# Patient Record
Sex: Female | Born: 1985 | Race: Black or African American | Hispanic: No | Marital: Single | Smoking: Never smoker
Health system: Southern US, Community
[De-identification: ages and names within clinical notes are randomized; demographics above are authoritative.]

## PROBLEM LIST (undated history)

## (undated) HISTORY — PX: BREAST LUMPECTOMY: SHX2

---

## 2007-03-28 ENCOUNTER — Ambulatory Visit (HOSPITAL_COMMUNITY): Admission: RE | Admit: 2007-03-28 | Discharge: 2007-03-28 | Payer: Self-pay | Admitting: Obstetrics and Gynecology

## 2010-05-09 ENCOUNTER — Encounter: Payer: Self-pay | Admitting: Obstetrics and Gynecology

## 2013-03-16 ENCOUNTER — Encounter (HOSPITAL_COMMUNITY): Payer: Self-pay | Admitting: Emergency Medicine

## 2013-03-16 ENCOUNTER — Emergency Department (HOSPITAL_COMMUNITY)
Admission: EM | Admit: 2013-03-16 | Discharge: 2013-03-16 | Disposition: A | Discharge status: Home / Self Care | Payer: Self-pay | Attending: Emergency Medicine | Admitting: Emergency Medicine

## 2013-03-16 DIAGNOSIS — K089 Disorder of teeth and supporting structures, unspecified: Secondary | ICD-10-CM | POA: Insufficient documentation

## 2013-03-16 DIAGNOSIS — K029 Dental caries, unspecified: Secondary | ICD-10-CM | POA: Insufficient documentation

## 2013-03-16 DIAGNOSIS — R609 Edema, unspecified: Secondary | ICD-10-CM | POA: Insufficient documentation

## 2013-03-16 DIAGNOSIS — K0889 Other specified disorders of teeth and supporting structures: Secondary | ICD-10-CM

## 2013-03-16 MED ORDER — PENICILLIN V POTASSIUM 500 MG PO TABS: 500.0000 mg | ORAL_TABLET | Freq: Three times a day (TID) | ORAL | Status: DC

## 2013-03-16 MED ORDER — PENICILLIN V POTASSIUM 500 MG PO TABS
500.0000 mg | ORAL_TABLET | Freq: Once | ORAL | Status: AC
  Administered 2013-03-16: 500 mg via ORAL
  Filled 2013-03-16: qty 1

## 2013-03-16 MED ORDER — IBUPROFEN 800 MG PO TABS
800.0000 mg | ORAL_TABLET | Freq: Once | ORAL | Status: AC
  Administered 2013-03-16: 800 mg via ORAL
  Filled 2013-03-16: qty 1

## 2013-03-16 MED ORDER — HYDROCODONE-ACETAMINOPHEN 5-325 MG PO TABS: 1.0000 | ORAL_TABLET | ORAL | Status: DC | PRN

## 2013-03-16 NOTE — ED Notes (Signed)
Pt states that she was here last night for lt sided dental pain.  States she was given a shot that numbed her mouth but "her teeth still hurt".

## 2013-03-16 NOTE — ED Notes (Signed)
Pt states she has two teeth on the bottom left that have been chipped down to the gum line and she has been in pain all day  Pt states she has taken OTC medication at home without relief  Pt has swelling noted to her lower jaw area

## 2013-03-16 NOTE — ED Provider Notes (Signed)
Medical screening examination/treatment/procedure(s) were performed by non-physician practitioner and as supervising physician I was immediately available for consultation/collaboration.  EKG Interpretation   None         Alita Waldren M Jerlyn Pain, MD 03/16/13 0851 

## 2013-03-16 NOTE — ED Provider Notes (Signed)
CSN: [id number]     Arrival date & time 03/16/13  0705 History   None    Chief Complaint  Patient presents with  . Dental Pain    HPI  April Allen is a 27 y.o. female with no PMH who presents to the ED for evaluation of dental pain.  History was provided by the patient.  Patient states that she has had dental pain for the past 24 hours.  Her pain is located in her left lower front molars.  Her pain is a constant throbbing pain.  She was seen in the ED overnight and prescribed penicillin and Vicodin but did not fill the prescriptions because the pharmacy was not open yet.  She went home but cannot sleep due to severe pain.  She denies any dental trauma/injuries.  No worsening of her condition.  She is more concerned with severe pain.  No fevers, headache, difficulty swallowing/breathing, neck pain, vision changes, abdominal pain, nausea, or emesis.  She drove to the ED and cannot get a ride home.  She does not have a dentist.  No hx of tobacco use.  LNMP 02/16/13    No past medical history on file. No past surgical history on file. Family History  Problem Relation Age of Onset  . Hypertension Other   . Diabetes Other   . Kidney disease Other    History  Substance Use Topics  . Smoking status: Never Smoker   . Smokeless tobacco: Not on file  . Alcohol Use: No   OB History   Grav Para Term Preterm Abortions TAB SAB Ect Mult Living                 Review of Systems  Constitutional: Negative for fever, chills, diaphoresis, activity change, appetite change and fatigue.  HENT: Positive for dental problem. Negative for congestion, ear pain, mouth sores, rhinorrhea, sore throat and trouble swallowing.   Eyes: Negative for visual disturbance.  Respiratory: Negative for cough and shortness of breath.   Cardiovascular: Negative for chest pain and leg swelling.  Gastrointestinal: Negative for nausea, vomiting and abdominal pain.  Musculoskeletal: Negative for back pain, myalgias, neck  pain and neck stiffness.  Skin: Negative for wound.  Neurological: Negative for weakness and headaches.    Allergies  Review of patient's allergies indicates no known allergies.  Home Medications   Current Outpatient Rx  Name  Route  Sig  Dispense  Refill  . HYDROcodone-acetaminophen (NORCO/VICODIN) 5-325 MG per tablet   Oral   Take 1 tablet by mouth every 4 (four) hours as needed for moderate pain.   20 tablet   0   . penicillin v potassium (VEETID) 500 MG tablet   Oral   Take 1 tablet (500 mg total) by mouth 3 (three) times daily.   30 tablet   0    LMP 02/16/2013  Filed Vitals:   03/16/13 0754  BP: 125/67  Pulse: 96  Temp: 98.7 F (37.1 C)  TempSrc: Oral  Resp: 20  SpO2: 100%    Physical Exam  Nursing note and vitals reviewed. Constitutional: She is oriented to person, place, and time. She appears well-developed and well-nourished. No distress.  HENT:  Head: Normocephalic and atraumatic.    Right Ear: External ear normal.  Left Ear: External ear normal.  Nose: Nose normal.  Mouth/Throat: Oropharynx is clear and moist. No oropharyngeal exudate.    Right TM unable to be visualized.  Left TM gray and translucent.  No trismus.  Dental caries present to the left front molars with no gingival edema/fluctuance.  Edema and induration to the left lower mandible which does not extend past the jaw line.  Uvula midline.    Eyes: Conjunctivae and EOM are normal. Pupils are equal, round, and reactive to light. Right eye exhibits no discharge. Left eye exhibits no discharge.  Neck: Normal range of motion. Neck supple.  No neck edema/tenderness throughout.  No LAD.  No limitations with ROM.    Cardiovascular: Normal rate, regular rhythm and normal heart sounds.  Exam reveals no gallop and no friction rub.   No murmur heard. Pulmonary/Chest: Effort normal and breath sounds normal. No respiratory distress. She has no wheezes. She has no rales. She exhibits no tenderness.   Abdominal: Soft. She exhibits no distension. There is no tenderness.  Musculoskeletal: Normal range of motion. She exhibits no edema and no tenderness.  Neurological: She is alert and oriented to person, place, and time.  Skin: Skin is warm and dry. She is not diaphoretic.    ED Course  Procedures (including critical care time) Labs Review Labs Reviewed - No data to display Imaging Review No results found.  EKG Interpretation   None       MDM   [name] is a 27 y.o. female with no PMH who presents to the ED for evaluation of dental pain.  Ibuprofen and dose of penicillin given.  Ibuprofen and Penicillin given.    Rechecks  8:00 AM = patient resting comfortably.  Pain improved.  States she feels "much better."  Spoke with patient about discharge instructions, follow-up, and return precautions.       Etiology of dental pain possibly due to dental caries with dental abscess.  No concerning signs/symptoms for Ludwig's Angina at this time.  Patient afebrile and non-toxic in appearance.  Patient instructed to follow-up with a dentist for further evaluation and management.  Resources provided.  Return precautions were given.  Patient has prescription for Vicodin and Penicillin from her previous visit.  Pain improved before discharge. Patient in agreement with discharge and plan.    Discharge Medication List as of 03/16/2013  8:29 AM       Final impressions: 1. Pain, dental       April Allen        April Ledger, PA-C 03/16/13 [phone]

## 2013-03-16 NOTE — ED Provider Notes (Signed)
CSN: [id number]     Arrival date & time 03/16/13  0030 History   First MD Initiated Contact with Patient 03/16/13 0035     Chief Complaint  Patient presents with  . Dental Pain   (Consider location/radiation/quality/duration/timing/severity/associated sxs/prior Treatment) HPI History provided by pt.   Pt presents w/ severe left lower dental pain that she woke with this morning.  Aggravated by cold air/liquids.  No relief w/ tylenol.  No associated sx.  Does not currently have a dentist.  History reviewed. No pertinent past medical history. History reviewed. No pertinent past surgical history. Family History  Problem Relation Age of Onset  . Hypertension Other   . Diabetes Other   . Kidney disease Other    History  Substance Use Topics  . Smoking status: Never Smoker   . Smokeless tobacco: Not on file  . Alcohol Use: No   OB History   Grav Para Term Preterm Abortions TAB SAB Ect Mult Living                 Review of Systems  All other systems reviewed and are negative.    Allergies  Review of patient's allergies indicates no known allergies.  Home Medications   Current Outpatient Rx  Name  Route  Sig  Dispense  Refill  . HYDROcodone-acetaminophen (NORCO/VICODIN) 5-325 MG per tablet   Oral   Take 1 tablet by mouth every 4 (four) hours as needed for moderate pain.   20 tablet   0   . penicillin v potassium (VEETID) 500 MG tablet   Oral   Take 1 tablet (500 mg total) by mouth 3 (three) times daily.   30 tablet   0    BP 140/99  Pulse 121  Temp(Src) 98.3 F (36.8 C) (Oral)  Resp 20  Wt 156 lb (70.761 kg)  SpO2 99%  LMP 02/16/2013 Physical Exam  Nursing note and vitals reviewed. Constitutional: She is oriented to person, place, and time. She appears well-developed and well-nourished.  HENT:  Head: Normocephalic and atraumatic.  Mouth/Throat: Uvula is midline and oropharynx is clear and moist. No trismus in the jaw.  L lower 1st and 2nd molars w/ advanced  decay.  Both ttp w/ guarding.  Adjacent gingiva appears normal but is also ttp.  No edema of buccal mucosa.    Eyes:  Normal appearance  Neck: Normal range of motion. Neck supple.  No submandibular edema  Lymphadenopathy:    She has no cervical adenopathy.  Neurological: She is alert and oriented to person, place, and time.  Psychiatric: She has a normal mood and affect. Her behavior is normal.    ED Course  NERVE BLOCK Date/Time: 03/16/2013 7:58 AM Performed by: Otilio Miu Authorized by: Ruby Cola E Consent: Verbal consent obtained. Risks and benefits: risks, benefits and alternatives were discussed Consent given by: patient Patient understanding: patient states understanding of the procedure being performed Patient identity confirmed: verbally with patient Indications: pain relief Body area: face/mouth Laterality: right Patient sedated: no Patient position: sitting Needle gauge: 25 G Local anesthetic: bupivacaine 0.25% with epinephrine Anesthetic total: 1.8 ml Outcome: pain unchanged Patient tolerance: Patient tolerated the procedure well with no immediate complications. Comments: Periapical block at 1st and 2nd L lower molars.  Pain resolved completely for ~15 minutes and then returned to baseline.     (including critical care time)  Labs Review Labs Reviewed - No data to display Imaging Review No results found.  EKG Interpretation  None       MDM   1. Pain, dental    27yo F presents w/ dental pain w/ buccal mucosa edema.  Suspect periapical abscess.  Periapical block performed w/ immediate but temporary relief.  Pt prescribed penicillin and short course of vicodin and referred to dentist on call.  Return precautions discussed.     Otilio Miu, PA-C 03/16/13 [phone]

## 2013-03-17 ENCOUNTER — Emergency Department (HOSPITAL_COMMUNITY)
Admission: EM | Admit: 2013-03-17 | Discharge: 2013-03-17 | Disposition: A | Discharge status: Home / Self Care | Payer: Self-pay | Attending: Emergency Medicine | Admitting: Emergency Medicine

## 2013-03-17 ENCOUNTER — Encounter (HOSPITAL_COMMUNITY): Payer: Self-pay | Admitting: Emergency Medicine

## 2013-03-17 DIAGNOSIS — K029 Dental caries, unspecified: Secondary | ICD-10-CM | POA: Insufficient documentation

## 2013-03-17 DIAGNOSIS — K047 Periapical abscess without sinus: Secondary | ICD-10-CM | POA: Insufficient documentation

## 2013-03-17 DIAGNOSIS — R000 Tachycardia, unspecified: Secondary | ICD-10-CM | POA: Insufficient documentation

## 2013-03-17 DIAGNOSIS — Z792 Long term (current) use of antibiotics: Secondary | ICD-10-CM | POA: Insufficient documentation

## 2013-03-17 MED ORDER — IBUPROFEN 800 MG PO TABS
800.0000 mg | ORAL_TABLET | Freq: Once | ORAL | Status: AC
  Administered 2013-03-17: 800 mg via ORAL
  Filled 2013-03-17: qty 1

## 2013-03-17 NOTE — ED Provider Notes (Signed)
CSN: [id number]     Arrival date & time 03/17/13  0200 History   First MD Initiated Contact with Patient 03/17/13 0214     Chief Complaint  Patient presents with  . Dental Pain   HPI  History provided by the patient and recent medical chart. Patient is a 27 year old female who returns with complaints of worsening pain and swelling to her left lower mouth and jaw area. Patient was seen early yesterday morning around midnight with similar complaints of pain and swelling. Her symptoms began on Friday and had worsened. She was treated with a dental block in the emergency department and given prescriptions for pain and antibiotic medications. She returned home feeling improved was able to sleep and rest but her symptoms have returned and worsened later in the morning and she returned to the emergency department and was again seen and evaluated for her similar complaint. She was encouraged to take her medications. Patient states she was able to take 3 doses of her penicillin yesterday and continue to use ibuprofen at home for pain. She had been doing well until early this morning when she awoke and had difficulty sleeping due to increased pain and swelling. She states swelling has now increased significantly in her lower jaw and mouth area. She denies having any bleeding or drainage her mouth. Denies any pain or swelling under the tongue. No difficulty breathing or swallowing. No other aggravating or alleviating factors. No other associated symptoms. No fever, chills or sweats.     History reviewed. No pertinent past medical history. History reviewed. No pertinent past surgical history. Family History  Problem Relation Age of Onset  . Hypertension Other   . Diabetes Other   . Kidney disease Other    History  Substance Use Topics  . Smoking status: Never Smoker   . Smokeless tobacco: Not on file  . Alcohol Use: No   OB History   Grav Para Term Preterm Abortions TAB SAB Ect Mult Living                  Review of Systems  Constitutional: Negative for fever.  HENT: Negative for trouble swallowing.   Respiratory: Negative for shortness of breath.   Gastrointestinal: Negative for nausea and vomiting.  All other systems reviewed and are negative.    Allergies  Review of patient's allergies indicates no known allergies.  Home Medications   Current Outpatient Rx  Name  Route  Sig  Dispense  Refill  . HYDROcodone-acetaminophen (NORCO/VICODIN) 5-325 MG per tablet   Oral   Take 1 tablet by mouth every 4 (four) hours as needed for moderate pain.   20 tablet   0   . penicillin v potassium (VEETID) 500 MG tablet   Oral   Take 1 tablet (500 mg total) by mouth 3 (three) times daily.   30 tablet   0    BP 136/65  Pulse 115  Temp(Src) 98.4 F (36.9 C) (Oral)  Resp 16  Ht 5\' 5"  (1.651 m)  Wt 156 lb (70.761 kg)  BMI 25.96 kg/m2  SpO2 100%  LMP 02/16/2013 Physical Exam  Nursing note and vitals reviewed. Constitutional: She is oriented to person, place, and time. She appears well-developed and well-nourished.  HENT:  Head: Normocephalic and atraumatic.  Mouth/Throat: Uvula is midline and oropharynx is clear and moist. No trismus in the jaw.  L lower 1st and 2nd molars w/ advanced decay.  Both ttp w/ guarding.  Adjacent gingiva are swollen with  slight fluctuance. There is additional tenderness over this area. No bleeding or drainage. No pain or swelling under the tongue. No signs concerning for Ludwick's angina.    Eyes:  Normal appearance  Neck: Normal range of motion. Neck supple.  No submandibular edema  Cardiovascular: Regular rhythm.  Tachycardia present.   No murmur heard. Pulmonary/Chest: No respiratory distress. She has no wheezes. She has no rales.  Lymphadenopathy:    She has no cervical adenopathy.  Neurological: She is alert and oriented to person, place, and time.  Skin: Skin is warm.  Psychiatric: She has a normal mood and affect. Her behavior is normal.     ED Course  Procedures    DIAGNOSTIC STUDIES: Oxygen Saturation is 100% on room air.  COORDINATION OF CARE:  Nursing notes reviewed. Vital signs reviewed. Initial pt interview and examination performed.   2:23 AM-patient seen and evaluated. Patient returning with worsening swelling and pain left lower mouth and dentition. There is fluctuance to the area. Discussed treatment plan with pt at bedside, which includes dental block an I&D. Pt agrees with plan.  INCISION AND DRAINAGE Performed by: Angus Seller Consent: Verbal consent obtained. Risks and benefits: risks, benefits and alternatives were discussed Type: Dental abscess  Body area: Left lower molar and premolar area  Anesthesia: local infiltration  Incision was made with a scalpel.  Local anesthetic: Bupivacaine 0.5% with epinephrine  Anesthetic total: 1.8 ml  Complexity: Simple  Drainage: purulent.   Drainage amount: Small  Packing material: None  Patient tolerance: Patient tolerated the procedure well with no immediate complications.       MDM   1. Dental abscess        Angus Seller, PA-C 03/17/13 [phone]

## 2013-03-17 NOTE — ED Provider Notes (Signed)
Medical screening examination/treatment/procedure(s) were performed by non-physician practitioner and as supervising physician I was immediately available for consultation/collaboration.    Sunnie Nielsen, MD 03/17/13 0630

## 2013-03-17 NOTE — ED Notes (Signed)
Pt c/o LL dental pain with swelling noted. Pt c/o pain to area. Pt has been seen x 2 at this facility for same in last 24 hours. Pt has not taken Vicodin for pain only Ibuprofen.

## 2013-03-21 NOTE — ED Provider Notes (Signed)
Medical screening examination/treatment/procedure(s) were performed by non-physician practitioner and as supervising physician I was immediately available for consultation/collaboration.  Flint Melter, MD 03/21/13 [phone]

## 2014-02-05 ENCOUNTER — Encounter (HOSPITAL_COMMUNITY): Payer: Self-pay | Admitting: Emergency Medicine

## 2014-02-05 ENCOUNTER — Emergency Department (HOSPITAL_COMMUNITY)
Admission: EM | Admit: 2014-02-05 | Discharge: 2014-02-05 | Disposition: A | Discharge status: Home / Self Care | Payer: Self-pay | Attending: Emergency Medicine | Admitting: Emergency Medicine

## 2014-02-05 DIAGNOSIS — M546 Pain in thoracic spine: Secondary | ICD-10-CM | POA: Insufficient documentation

## 2014-02-05 DIAGNOSIS — M545 Low back pain: Secondary | ICD-10-CM | POA: Insufficient documentation

## 2014-02-05 DIAGNOSIS — R197 Diarrhea, unspecified: Secondary | ICD-10-CM | POA: Insufficient documentation

## 2014-02-05 DIAGNOSIS — R112 Nausea with vomiting, unspecified: Secondary | ICD-10-CM | POA: Insufficient documentation

## 2014-02-05 DIAGNOSIS — R1033 Periumbilical pain: Secondary | ICD-10-CM | POA: Insufficient documentation

## 2014-02-05 DIAGNOSIS — Z3202 Encounter for pregnancy test, result negative: Secondary | ICD-10-CM | POA: Insufficient documentation

## 2014-02-05 DIAGNOSIS — M549 Dorsalgia, unspecified: Secondary | ICD-10-CM

## 2014-02-05 LAB — URINALYSIS, ROUTINE W REFLEX MICROSCOPIC
Bilirubin Urine: NEGATIVE
Glucose, UA: NEGATIVE mg/dL
KETONES UR: NEGATIVE mg/dL
LEUKOCYTES UA: NEGATIVE
Nitrite: NEGATIVE
PROTEIN: NEGATIVE mg/dL
Specific Gravity, Urine: 1.025 (ref 1.005–1.030)
Urobilinogen, UA: 1 mg/dL (ref 0.0–1.0)
pH: 7 (ref 5.0–8.0)

## 2014-02-05 LAB — URINE MICROSCOPIC-ADD ON

## 2014-02-05 LAB — PREGNANCY, URINE: Preg Test, Ur: NEGATIVE

## 2014-02-05 MED ORDER — HYDROCODONE-ACETAMINOPHEN 5-325 MG PO TABS: 1.0000 | ORAL_TABLET | ORAL | Status: DC | PRN

## 2014-02-05 MED ORDER — CYCLOBENZAPRINE HCL 10 MG PO TABS
10.0000 mg | ORAL_TABLET | Freq: Two times a day (BID) | ORAL | Status: DC | PRN
Start: 2014-02-05 — End: 2021-03-22

## 2014-02-05 MED ORDER — IBUPROFEN 800 MG PO TABS
800.0000 mg | ORAL_TABLET | Freq: Once | ORAL | Status: AC
  Administered 2014-02-05: 800 mg via ORAL
  Filled 2014-02-05: qty 1

## 2014-02-05 MED ORDER — IBUPROFEN 800 MG PO TABS: 800.0000 mg | ORAL_TABLET | Freq: Three times a day (TID) | ORAL | Status: DC

## 2014-02-05 NOTE — ED Provider Notes (Signed)
CSN: [id number]     Arrival date & time 02/05/14  1019 History   First MD Initiated Contact with Patient 02/05/14 1055     Chief Complaint  Patient presents with  . Back Pain  . Nausea  . Emesis  . Diarrhea     (Consider location/radiation/quality/duration/timing/severity/associated sxs/prior Treatment) Patient is a 28 y.o. female presenting with back pain, vomiting, diarrhea, and abdominal pain. The history is provided by the patient. No language interpreter was used.  Back Pain Location:  Thoracic spine and lumbar spine Quality:  Aching and stabbing Pain severity:  Severe Onset quality:  Gradual Timing:  Constant Progression:  Worsening Chronicity:  New Associated symptoms: abdominal pain   Associated symptoms: no chest pain, no dysuria, no fever, no numbness, no pelvic pain and no weakness   Associated symptoms comment:  Bilateral paraspinal back pain from thorax to lumbar spine. 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 She started having pain last night that is worse today, and is described as worse with movement and better with rest. The pain radiates today to right hip. No weakness of lower extremities, urinary or bowel incontinence. Emesis Associated symptoms: abdominal pain and diarrhea   Associated symptoms: no chills   Diarrhea Associated symptoms: abdominal pain and vomiting   Associated symptoms: no chills and no fever   Abdominal Pain Pain location:  Periumbilical Pain quality: aching   Pain radiates to:  Does not radiate Pain severity:  Mild Associated symptoms: diarrhea and vomiting   Associated symptoms: no chest pain, no chills, no cough, no dysuria, no fever, no shortness of breath, no vaginal bleeding and no vaginal discharge   Associated symptoms comment:  She feels the pain from her back is affecting her abdomen around the umbilicus. She has had nausea, vomiting and diarrhea since onset of back pain. No pelvic  pain, dysuria, vaginal discharge or irregular menses. No fever.    History reviewed. No pertinent past medical history. No past surgical history on file. Family History  Problem Relation Age of Onset  . Hypertension Other   . Diabetes Other   . Kidney disease Other    History  Substance Use Topics  . Smoking status: Never Smoker   . Smokeless tobacco: Not on file  . Alcohol Use: No   OB History   Grav Para Term Preterm Abortions TAB SAB Ect Mult Living                 Review of Systems  Constitutional: Negative for fever and chills.  HENT: Negative.   Respiratory: Negative.  Negative for cough and shortness of breath.   Cardiovascular: Negative.  Negative for chest pain.  Gastrointestinal: Positive for vomiting, abdominal pain and diarrhea.  Genitourinary: Negative.  Negative for dysuria, vaginal bleeding, vaginal discharge, menstrual problem and pelvic pain.  Musculoskeletal: Positive for back pain.  Skin: Negative.  Negative for color change and wound.  Neurological: Negative.  Negative for weakness and numbness.      Allergies  Review of patient's allergies indicates no known allergies.  Home Medications   Prior to Admission medications   Not on File   BP 113/55  Pulse 120  Temp(Src) 97.8 F (36.6 C) (Oral)  Resp 18  SpO2 100%  LMP 01/20/2014 Physical Exam  Constitutional: She is oriented to person, place, and time. She appears well-developed and well-nourished.  Neck: Normal range of motion.  Pulmonary/Chest: Effort normal.  Abdominal: Soft. Bowel  sounds are normal. She exhibits no distension and no mass. There is tenderness. There is no rebound and no guarding.  Mid-abdomen TTP without peritonitis. No distention. BS positive throughout.  Musculoskeletal:  Midline and bilateral paraspinal tenderness from thorax to lumbar spine. No swelling or discoloration.  Neurological: She is alert and oriented to person, place, and time.  Skin: Skin is warm and dry.   Psychiatric: She has a normal mood and affect.    ED Course  Procedures (including critical care time) Labs Review Labs Reviewed  URINALYSIS, ROUTINE W REFLEX MICROSCOPIC  PREGNANCY, URINE   Results for orders placed during the hospital encounter of 02/05/14  URINALYSIS, ROUTINE W REFLEX MICROSCOPIC      Result Value Ref Range   Color, Urine YELLOW  YELLOW   APPearance CLOUDY (*) CLEAR   Specific Gravity, Urine 1.025  1.005 - 1.030   pH 7.0  5.0 - 8.0   Glucose, UA NEGATIVE  NEGATIVE mg/dL   Hgb urine dipstick TRACE (*) NEGATIVE   Bilirubin Urine NEGATIVE  NEGATIVE   Ketones, ur NEGATIVE  NEGATIVE mg/dL   Protein, ur NEGATIVE  NEGATIVE mg/dL   Urobilinogen, UA 1.0  0.0 - 1.0 mg/dL   Nitrite NEGATIVE  NEGATIVE   Leukocytes, UA NEGATIVE  NEGATIVE  PREGNANCY, URINE      Result Value Ref Range   Preg Test, Ur NEGATIVE  NEGATIVE  URINE MICROSCOPIC-ADD ON      Result Value Ref Range   Squamous Epithelial / LPF FEW (*) RARE   WBC, UA 0-2  <3 WBC/hpf   RBC / HPF 0-2  <3 RBC/hpf   Bacteria, UA RARE  RARE   Urine-Other MUCOUS PRESENT       Imaging Review No results found.   EKG Interpretation None      MDM   Final diagnoses:  None    1. Bilateral back pain 2. Periumbilical abdominal pain  The patient is very well appearing, NAD. No vomiting, diarrhea while in ED. Afebrile. She feels abdominal and back pain is related to heavy lifting at work, requesting work note to be off the job while healing, which will be provided. She reports pain increases with movement and is better with rest, following muscular pattern. We discussed return precautions in depth.      Arnoldo HookerShari A Gregor Dershem, PA-C 02/15/14 1520  Raeford RazorStephen Kohut, MD 02/20/14 1059

## 2014-02-05 NOTE — Discharge Instructions (Signed)
Abdominal Pain Many things can cause abdominal pain. Usually, abdominal pain is not caused by a disease and will improve without treatment. It can often be observed and treated at home. Your health care provider will do a physical exam and possibly order blood tests and X-rays to help determine the seriousness of your pain. However, in many cases, more time must pass before a clear cause of the pain can be found. Before that point, your health care provider may not know if you need more testing or further treatment. HOME CARE INSTRUCTIONS  Monitor your abdominal pain for any changes. The following actions may help to alleviate any discomfort you are experiencing:  Only take over-the-counter or prescription medicines as directed by your health care provider.  Do not take laxatives unless directed to do so by your health care provider.  Try a clear liquid diet (broth, tea, or water) as directed by your health care provider. Slowly move to a bland diet as tolerated. SEEK MEDICAL CARE IF:  You have unexplained abdominal pain.  You have abdominal pain associated with nausea or diarrhea.  You have pain when you urinate or have a bowel movement.  You experience abdominal pain that wakes you in the night.  You have abdominal pain that is worsened or improved by eating food.  You have abdominal pain that is worsened with eating fatty foods.  You have a fever. SEEK IMMEDIATE MEDICAL CARE IF:   Your pain does not go away within 2 hours.  You keep throwing up (vomiting).  Your pain is felt only in portions of the abdomen, such as the right side or the left lower portion of the abdomen.  You pass bloody or black tarry stools. MAKE SURE YOU:  Understand these instructions.   Will watch your condition.   Will get help right away if you are not doing well or get worse.  Document Released: 01/12/2005 Document Revised: 04/09/2013 Document Reviewed: 12/12/2012 [facility] Patient Information  2015 [place], [place]. This information is not intended to replace advice given to you by your health care provider. Make sure you discuss any questions you have with your health care provider. Cryotherapy Cryotherapy means treatment with cold. Ice or gel packs can be used to reduce both pain and swelling. Ice is the most helpful within the first 24 to 48 hours after an injury or flare-up from overusing a muscle or joint. Sprains, strains, spasms, burning pain, shooting pain, and aches can all be eased with ice. Ice can also be used when recovering from surgery. Ice is effective, has very few side effects, and is safe for most people to use. PRECAUTIONS  Ice is not a safe treatment option for people with:  Raynaud phenomenon. This is a condition affecting small blood vessels in the extremities. Exposure to cold may cause your problems to return.  Cold hypersensitivity. There are many forms of cold hypersensitivity, including:  Cold urticaria. Red, itchy hives appear on the skin when the tissues begin to warm after being iced.  Cold erythema. This is a red, itchy rash caused by exposure to cold.  Cold hemoglobinuria. Red blood cells break down when the tissues begin to warm after being iced. The hemoglobin that carry oxygen are passed into the urine because they cannot combine with blood proteins fast enough.  Numbness or altered sensitivity in the area being iced. If you have any of the following conditions, do not use ice until you have discussed cryotherapy with your caregiver:  Heart conditions,  such as arrhythmia, angina, or chronic heart disease.  High blood pressure.  Healing wounds or open skin in the area being iced.  Current infections.  Rheumatoid arthritis.  Poor circulation.  Diabetes. Ice slows the blood flow in the region it is applied. This is beneficial when trying to stop inflamed tissues from spreading irritating chemicals to surrounding tissues. However, if you expose  your skin to cold temperatures for too long or without the proper protection, you can damage your skin or nerves. Watch for signs of skin damage due to cold. HOME CARE INSTRUCTIONS Follow these tips to use ice and cold packs safely.  Place a dry or damp towel between the ice and skin. A damp towel will cool the skin more quickly, so you may need to shorten the time that the ice is used.  For a more rapid response, add gentle compression to the ice.  Ice for no more than 10 to 20 minutes at a time. The bonier the area you are icing, the less time it will take to get the benefits of ice.  Check your skin after 5 minutes to make sure there are no signs of a poor response to cold or skin damage.  Rest 20 minutes or more between uses.  Once your skin is numb, you can end your treatment. You can test numbness by very lightly touching your skin. The touch should be so light that you do not see the skin dimple from the pressure of your fingertip. When using ice, most people will feel these normal sensations in this order: cold, burning, aching, and numbness.  Do not use ice on someone who cannot communicate their responses to pain, such as small children or people with dementia. HOW TO MAKE AN ICE PACK Ice packs are the most common way to use ice therapy. Other methods include ice massage, ice baths, and cryosprays. Muscle creams that cause a cold, tingly feeling do not offer the same benefits that ice offers and should not be used as a substitute unless recommended by your caregiver. To make an ice pack, do one of the following:  Place crushed ice or a bag of frozen vegetables in a sealable plastic bag. Squeeze out the excess air. Place this bag inside another plastic bag. Slide the bag into a pillowcase or place a damp towel between your skin and the bag.  Mix 3 parts water with 1 part rubbing alcohol. Freeze the mixture in a sealable plastic bag. When you remove the mixture from the freezer, it  will be slushy. Squeeze out the excess air. Place this bag inside another plastic bag. Slide the bag into a pillowcase or place a damp towel between your skin and the bag. SEEK MEDICAL CARE IF:  You develop white spots on your skin. This may give the skin a blotchy (mottled) appearance.  Your skin turns blue or pale.  Your skin becomes waxy or hard.  Your swelling gets worse. MAKE SURE YOU:   Understand these instructions.  Will watch your condition.  Will get help right away if you are not doing well or get worse. Document Released: 11/29/2010 Document Revised: 08/19/2013 Document Reviewed: 11/29/2010 [facility] Patient Information 2015 [place], [place]. This information is not intended to replace advice given to you by your health care provider. Make sure you discuss any questions you have with your health care provider. Muscle Strain A muscle strain is an injury that occurs when a muscle is stretched beyond its normal length. Usually  a small number of muscle fibers are torn when this happens. Muscle strain is rated in degrees. First-degree strains have the least amount of muscle fiber tearing and pain. Second-degree and third-degree strains have increasingly more tearing and pain.  Usually, recovery from muscle strain takes 1-2 weeks. Complete healing takes 5-6 weeks.  CAUSES  Muscle strain happens when a sudden, violent force placed on a muscle stretches it too far. This may occur with lifting, sports, or a fall.  RISK FACTORS Muscle strain is especially common in athletes.  SIGNS AND SYMPTOMS At the site of the muscle strain, there may be:  Pain.  Bruising.  Swelling.  Difficulty using the muscle due to pain or lack of normal function. DIAGNOSIS  Your health care provider will perform a physical exam and ask about your medical history. TREATMENT  Often, the best treatment for a muscle strain is resting, icing, and applying cold compresses to the injured area.  HOME CARE  INSTRUCTIONS   Use the PRICE method of treatment to promote muscle healing during the first 2-3 days after your injury. The PRICE method involves:  Protecting the muscle from being injured again.  Restricting your activity and resting the injured body part.  Icing your injury. To do this, put ice in a plastic bag. Place a towel between your skin and the bag. Then, apply the ice and leave it on from 15-20 minutes each hour. After the third day, switch to moist heat packs.  Apply compression to the injured area with a splint or elastic bandage. Be careful not to wrap it too tightly. This may interfere with blood circulation or increase swelling.  Elevate the injured body part above the level of your heart as often as you can.  Only take over-the-counter or prescription medicines for pain, discomfort, or fever as directed by your health care provider.  Warming up prior to exercise helps to prevent future muscle strains. SEEK MEDICAL CARE IF:   You have increasing pain or swelling in the injured area.  You have numbness, tingling, or a significant loss of strength in the injured area. MAKE SURE YOU:   Understand these instructions.  Will watch your condition.  Will get help right away if you are not doing well or get worse. Document Released: 04/04/2005 Document Revised: 01/23/2013 Document Reviewed: 11/01/2012 [facility] Patient Information 2015 [place], [place]. This information is not intended to replace advice given to you by your health care provider. Make sure you discuss any questions you have with your health care provider. Back Pain, Adult Low back pain is very common. About 1 in 5 people have back pain.The cause of low back pain is rarely dangerous. The pain often gets better over time.About half of people with a sudden onset of back pain feel better in just 2 weeks. About 8 in 10 people feel better by 6 weeks.  CAUSES Some common causes of back pain include:  Strain of the  muscles or ligaments supporting the spine.  Wear and tear (degeneration) of the spinal discs.  Arthritis.  Direct injury to the back. DIAGNOSIS Most of the time, the direct cause of low back pain is not known.However, back pain can be treated effectively even when the exact cause of the pain is unknown.Answering your caregiver's questions about your overall health and symptoms is one of the most accurate ways to make sure the cause of your pain is not dangerous. If your caregiver needs more information, he or she may order lab work  or imaging tests (X-rays or MRIs).However, even if imaging tests show changes in your back, this usually does not require surgery. HOME CARE INSTRUCTIONS For many people, back pain returns.Since low back pain is rarely dangerous, it is often a condition that people can learn to [facility]manageon their own.   Remain active. It is stressful on the back to sit or stand in one place. Do not sit, drive, or stand in one place for more than 30 minutes at a time. Take short walks on level surfaces as soon as pain allows.Try to increase the length of time you walk each day.  Do not stay in bed.Resting more than 1 or 2 days can delay your recovery.  Do not avoid exercise or work.Your body is made to move.It is not dangerous to be active, even though your back may hurt.Your back will likely heal faster if you return to being active before your pain is gone.  Pay attention to your body when you bend and lift. Many people have less discomfortwhen lifting if they bend their knees, keep the load close to their bodies,and avoid twisting. Often, the most comfortable positions are those that put less stress on your recovering back.  Find a comfortable position to sleep. Use a firm mattress and lie on your side with your knees slightly bent. If you lie on your back, put a pillow under your knees.  Only take over-the-counter or prescription medicines as directed by your caregiver.  Over-the-counter medicines to reduce pain and inflammation are often the most helpful.Your caregiver may prescribe muscle relaxant drugs.These medicines help dull your pain so you can more quickly return to your normal activities and healthy exercise.  Put ice on the injured area.  Put ice in a plastic bag.  Place a towel between your skin and the bag.  Leave the ice on for 15-20 minutes, 03-04 times a day for the first 2 to 3 days. After that, ice and heat may be alternated to reduce pain and spasms.  Ask your caregiver about trying back exercises and gentle massage. This may be of some benefit.  Avoid feeling anxious or stressed.Stress increases muscle tension and can worsen back pain.It is important to recognize when you are anxious or stressed and learn ways to manage it.Exercise is a great option. SEEK MEDICAL CARE IF:  You have pain that is not relieved with rest or medicine.  You have pain that does not improve in 1 week.  You have new symptoms.  You are generally not feeling well. SEEK IMMEDIATE MEDICAL CARE IF:   You have pain that radiates from your back into your legs.  You develop new bowel or bladder control problems.  You have unusual weakness or numbness in your arms or legs.  You develop nausea or vomiting.  You develop abdominal pain.  You feel faint. Document Released: 04/04/2005 Document Revised: 10/04/2011 Document Reviewed: 08/06/2013 [facility] Patient Information 2015 [place], [place]. This information is not intended to replace advice given to you by your health care provider. Make sure you discuss any questions you have with your health care provider.

## 2014-02-05 NOTE — ED Notes (Addendum)
Pt states she works at [employer] and lifts heavy boxes all day.  C/o rt low back pain radiating to rt hip.  Also c/o NVD since yesterday w/ mid abd pain.

## 2014-04-14 ENCOUNTER — Encounter (HOSPITAL_COMMUNITY): Payer: Self-pay | Admitting: Emergency Medicine

## 2014-04-14 ENCOUNTER — Emergency Department (HOSPITAL_COMMUNITY)
Admission: EM | Admit: 2014-04-14 | Discharge: 2014-04-14 | Disposition: A | Discharge status: Home / Self Care | Payer: Self-pay | Attending: Emergency Medicine | Admitting: Emergency Medicine

## 2014-04-14 DIAGNOSIS — R600 Localized edema: Secondary | ICD-10-CM | POA: Insufficient documentation

## 2014-04-14 DIAGNOSIS — R609 Edema, unspecified: Secondary | ICD-10-CM

## 2014-04-14 LAB — CBC
HCT: 33.3 % — ABNORMAL LOW (ref 36.0–46.0)
Hemoglobin: 11.2 g/dL — ABNORMAL LOW (ref 12.0–15.0)
MCH: 28.4 pg (ref 26.0–34.0)
MCHC: 33.6 g/dL (ref 30.0–36.0)
MCV: 84.3 fL (ref 78.0–100.0)
PLATELETS: 255 10*3/uL (ref 150–400)
RBC: 3.95 MIL/uL (ref 3.87–5.11)
RDW: 12.5 % (ref 11.5–15.5)
WBC: 6.1 10*3/uL (ref 4.0–10.5)

## 2014-04-14 LAB — BASIC METABOLIC PANEL
Anion gap: 7 (ref 5–15)
BUN: 9 mg/dL (ref 6–23)
CALCIUM: 9.1 mg/dL (ref 8.4–10.5)
CO2: 25 mmol/L (ref 19–32)
CREATININE: 0.39 mg/dL — AB (ref 0.50–1.10)
Chloride: 109 mEq/L (ref 96–112)
GFR calc non Af Amer: >90 mL/min (ref >90)
Glucose, Bld: 117 mg/dL — ABNORMAL HIGH (ref 70–99)
POTASSIUM: 3.7 mmol/L (ref 3.5–5.1)
Sodium: 141 mmol/L (ref 135–145)

## 2014-04-14 LAB — BRAIN NATRIURETIC PEPTIDE: B Natriuretic Peptide: 26.7 pg/mL (ref 0.0–100.0)

## 2014-04-14 MED ORDER — POTASSIUM CHLORIDE CRYS ER 20 MEQ PO TBCR: 40.0000 meq | EXTENDED_RELEASE_TABLET | Freq: Every day | ORAL | Status: DC

## 2014-04-14 MED ORDER — FUROSEMIDE 20 MG PO TABS: 20.0000 mg | ORAL_TABLET | Freq: Every day | ORAL | Status: DC

## 2014-04-14 NOTE — ED Notes (Signed)
Pt. reports bilateral lower legs swelling onset Saturday , denies injury , pt. stated her work requires standing for hours . Ambulatory / respirations unlabored .

## 2014-04-14 NOTE — Discharge Instructions (Signed)
Peripheral Edema °You have swelling in your legs (peripheral edema). This swelling is due to excess accumulation of salt and water in your body. Edema may be a sign of heart, kidney or liver disease, or a side effect of a medication. It may also be due to problems in the leg veins. Elevating your legs and using special support stockings may be very helpful, if the cause of the swelling is due to poor venous circulation. Avoid long periods of standing, whatever the cause. °Treatment of edema depends on identifying the cause. Chips, pretzels, pickles and other salty foods should be avoided. Restricting salt in your diet is almost always needed. Water pills (diuretics) are often used to remove the excess salt and water from your body via urine. These medicines prevent the kidney from reabsorbing sodium. This increases urine flow. °Diuretic treatment may also result in lowering of potassium levels in your body. Potassium supplements may be needed if you have to use diuretics daily. Daily weights can help you keep track of your progress in clearing your edema. You should call your caregiver for follow up care as recommended. °SEEK IMMEDIATE MEDICAL CARE IF:  °· You have increased swelling, pain, redness, or heat in your legs. °· You develop shortness of breath, especially when lying down. °· You develop chest or abdominal pain, weakness, or fainting. °· You have a fever. °Document Released: 05/12/2004 Document Revised: 06/27/2011 Document Reviewed: 04/22/2009 °Exit[employer]® Patient Information ©2015 [employer]. This information is not intended to replace advice given to you by your health care provider. Make sure you discuss any questions you have with your health care provider. ° °

## 2014-04-14 NOTE — ED Provider Notes (Signed)
CSN: [id number]     Arrival date & time 04/14/14  2001 History   First MD Initiated Contact with Patient 04/14/14 2129     Chief Complaint  Patient presents with  . Leg Swelling     (Consider location/radiation/quality/duration/timing/severity/associated sxs/prior Treatment) HPI Comments: Patient presents to the ER for evaluation of leg swelling. Patient reports that symptoms began 3 days ago. She noticed when she came home from work that her legs were swollen. The legs were improved when she woke up the next morning, but she has had persistent swelling yesterday and today. She denies any pain. There is no redness or warmth of the legs. She has not had any injury. Patient denies any chest pain, shortness of breath. She has no history of blood clots. She has no history of kidney disease.   History reviewed. No pertinent past medical history. Past Surgical History  Procedure Laterality Date  . Breast lumpectomy    . Cesarean section     Family History  Problem Relation Age of Onset  . Hypertension Other   . Diabetes Other   . Kidney disease Other    History  Substance Use Topics  . Smoking status: Never Smoker   . Smokeless tobacco: Not on file  . Alcohol Use: No   OB History    No data available     Review of Systems  Respiratory: Negative for shortness of breath.   Cardiovascular: Positive for leg swelling. Negative for chest pain.  All other systems reviewed and are negative.     Allergies  Review of patient's allergies indicates no known allergies.  Home Medications   Prior to Admission medications   Medication Sig Start Date End Date Taking? Authorizing Provider  cyclobenzaprine (FLEXERIL) 10 MG tablet Take 1 tablet (10 mg total) by mouth 2 (two) times daily as needed for muscle spasms. Patient not taking: Reported on 04/14/2014 02/05/14   Melvenia BeamShari A Upstill, PA-C  HYDROcodone-acetaminophen (NORCO/VICODIN) 5-325 MG per tablet Take 1-2 tablets by mouth every 4  (four) hours as needed. Patient not taking: Reported on 04/14/2014 02/05/14   Melvenia BeamShari A Upstill, PA-C  ibuprofen (ADVIL,MOTRIN) 800 MG tablet Take 1 tablet (800 mg total) by mouth 3 (three) times daily. Patient not taking: Reported on 04/14/2014 02/05/14   Melvenia BeamShari A Upstill, PA-C   BP 129/61 mmHg  Pulse 86  Temp(Src) 98.3 F (36.8 C) (Oral)  Resp 17  Ht 5\' 6"  (1.676 m)  Wt 160 lb (72.576 kg)  BMI 25.84 kg/m2  SpO2 100%  LMP 03/22/2014 Physical Exam  Constitutional: She is oriented to person, place, and time. She appears well-developed and well-nourished. No distress.  HENT:  Head: Normocephalic and atraumatic.  Right Ear: Hearing normal.  Left Ear: Hearing normal.  Nose: Nose normal.  Mouth/Throat: Oropharynx is clear and moist and mucous membranes are normal.  Eyes: Conjunctivae and EOM are normal. Pupils are equal, round, and reactive to light.  Neck: Normal range of motion. Neck supple.  Cardiovascular: Regular rhythm, S1 normal and S2 normal.  Exam reveals no gallop and no friction rub.   No murmur heard. Pulmonary/Chest: Effort normal and breath sounds normal. No respiratory distress. She exhibits no tenderness.  Abdominal: Soft. Normal appearance and bowel sounds are normal. There is no hepatosplenomegaly. There is no tenderness. There is no rebound, no guarding, no tenderness at McBurney's point and negative Murphy's sign. No hernia.  Musculoskeletal: Normal range of motion. She exhibits edema.  Neurological: She is alert and oriented to  person, place, and time. She has normal strength. No cranial nerve deficit or sensory deficit. Coordination normal. GCS eye subscore is 4. GCS verbal subscore is 5. GCS motor subscore is 6.  Skin: Skin is warm, dry and intact. No rash noted. No cyanosis.  Psychiatric: She has a normal mood and affect. Her speech is normal and behavior is normal. Thought content normal.  Nursing note and vitals reviewed.   ED Course  Procedures (including  critical care time) Labs Review Labs Reviewed  CBC - Abnormal; Notable for the following:    Hemoglobin 11.2 (*)    HCT 33.3 (*)    All other components within normal limits  BASIC METABOLIC PANEL - Abnormal; Notable for the following:    Glucose, Bld 117 (*)    Creatinine, Ser 0.39 (*)    All other components within normal limits  BRAIN NATRIURETIC PEPTIDE    Imaging Review No results found.   EKG Interpretation None      MDM   Final diagnoses:  None   peripheral edema  Patient presents to the ER for evaluation of bilateral lower extremity swelling. Patient is asymptomatic. Her workup is negative. There is no concern for congestive heart failure. This is peripheral edema. Patient will be given 2 days of Lasix and is instructed to stay off her feet when possible, elevate.   Gilda Creasehristopher J. Pollina, MD 04/14/14 [phone]

## 2014-04-14 NOTE — ED Notes (Signed)
Pt a/o x 4 on d/c with steady gait. 

## 2021-03-22 ENCOUNTER — Ambulatory Visit
Admission: EM | Admit: 2021-03-22 | Discharge: 2021-03-22 | Disposition: A | Discharge status: Home / Self Care | Payer: Medicaid Other

## 2021-03-22 ENCOUNTER — Other Ambulatory Visit: Payer: Self-pay

## 2021-03-22 DIAGNOSIS — M47816 Spondylosis without myelopathy or radiculopathy, lumbar region: Secondary | ICD-10-CM

## 2021-03-22 MED ORDER — KETOROLAC TROMETHAMINE 60 MG/2ML IM SOLN
60.0000 mg | Freq: Once | INTRAMUSCULAR | Status: AC
  Administered 2021-03-22: 60 mg via INTRAMUSCULAR

## 2021-03-22 MED ORDER — IBUPROFEN 800 MG PO TABS: 800.0000 mg | ORAL_TABLET | Freq: Three times a day (TID) | ORAL | 0 refills | Status: AC

## 2021-03-22 NOTE — ED Provider Notes (Signed)
[facility]    CSN: [id number] Arrival date & time: 03/22/21  1524    HISTORY  No chief complaint on file.  HPI April Allen is a 35 y.o. female. Pt reports having lower back pain (states she has osteoarthritis) that has flared up and tylenol is not helping.  Patient states she was last seen in urgent care in March 2022 for similar back pain.  Patient states never been formally diagnosed with any particular disease.  Patient states that no one else in her family has similar issues with her back, states its become sort of a family joke that she should be so young to have such advanced problems with her back.  EMR reviewed by me, x-ray of lumbar spine performed at urgent care in March urgent care was also reviewed by me.  This revealed mild facet arthropathy at L5-S1 along with subtle sclerosis along the iliac side of the sacroiliac joints, possibly from mild sacroiliitis or low-grade osteitis condensans ilii.   The history is provided by the patient.  History reviewed. No pertinent past medical history. There are no problems to display for this patient.  Past Surgical History:  Procedure Laterality Date   BREAST LUMPECTOMY     CESAREAN SECTION     OB History   No obstetric history on file.    Home Medications    Prior to Admission medications   Medication Sig Start Date End Date Taking? Authorizing Provider  acetaminophen (TYLENOL) 325 MG tablet Take 650 mg by mouth every 6 (six) hours as needed.   Yes [provider]  ibuprofen (ADVIL) 800 MG tablet Take 1 tablet (800 mg total) by mouth 3 (three) times daily for 14 days. 03/22/21 04/05/21 Yes Theadora Rama Scales, PA-C   Family History Family History  Problem Relation Age of Onset   Hypertension Other    Diabetes Other    Kidney disease Other    Social History Social History   Tobacco Use   Smoking status: Never  Substance Use Topics   Alcohol use: No   Drug use: No   Allergies   Patient  has no known allergies.  Review of Systems Review of Systems Pertinent findings noted in history of present illness.   Physical Exam Triage Vital Signs ED Triage Vitals  Enc Vitals Group     BP 02/12/21 0827 (!) 147/82     Pulse Rate 02/12/21 0827 72     Resp 02/12/21 0827 18     Temp 02/12/21 0827 98.3 F (36.8 C)     Temp Source 02/12/21 0827 Oral     SpO2 02/12/21 0827 98 %     Weight --      Height --      Head Circumference --      Peak Flow --      Pain Score 02/12/21 0826 5     Pain Loc --      Pain Edu? --      Excl. in GC? --   No data found.  Updated Vital Signs BP 113/71 (BP Location: Left Arm)   Pulse 67   Temp 98.5 F (36.9 C) (Oral)   Resp 18   LMP 02/23/2021   SpO2 98%   Physical Exam Vitals and nursing note reviewed.  Constitutional:      General: She is not in acute distress.    Appearance: Normal appearance. She is not ill-appearing.  HENT:     Head: Normocephalic and atraumatic.  Eyes:     General: Lids are normal.        Right eye: No discharge.        Left eye: No discharge.     Extraocular Movements: Extraocular movements intact.     Conjunctiva/sclera: Conjunctivae normal.     Right eye: Right conjunctiva is not injected.     Left eye: Left conjunctiva is not injected.  Neck:     Trachea: Trachea and phonation normal.  Cardiovascular:     Rate and Rhythm: Normal rate and regular rhythm.     Pulses: Normal pulses.     Heart sounds: Normal heart sounds. No murmur heard.   No friction rub. No gallop.  Pulmonary:     Effort: Pulmonary effort is normal. No accessory muscle usage, prolonged expiration or respiratory distress.     Breath sounds: Normal breath sounds. No stridor, decreased air movement or transmitted upper airway sounds. No decreased breath sounds, wheezing, rhonchi or rales.  Chest:     Chest wall: No tenderness.  Musculoskeletal:        General: Tenderness (Lumbar spine, right SI joint) present. Normal range of motion.      Cervical back: Normal range of motion and neck supple. Normal range of motion.  Lymphadenopathy:     Cervical: No cervical adenopathy.  Skin:    General: Skin is warm and dry.     Findings: No erythema or rash.  Neurological:     General: No focal deficit present.     Mental Status: She is alert and oriented to person, place, and time.  Psychiatric:        Mood and Affect: Mood normal.        Behavior: Behavior normal.    Visual Acuity Right Eye Distance:   Left Eye Distance:   Bilateral Distance:    Right Eye Near:   Left Eye Near:    Bilateral Near:     UC Couse / Diagnostics / Procedures:    EKG  Radiology No results found.  Procedures Procedures (including critical care time)  UC Diagnoses / Final Clinical Impressions(s)   I have reviewed the triage vital signs and the nursing notes.  Pertinent labs & imaging results that were available during my care of the patient were reviewed by me and considered in my medical decision making (see chart for details).   Final diagnoses:  Arthropathy of lumbar facet joint   Patient advised that we will assist her in finding a primary care provider says that she can be worked up for possible autoimmune disease such as ankylosing spondylitis.  Patient provided with information regarding this disease along with the results of her x-ray performed at the urgent care and March 2022.  ED Prescriptions     Medication Sig Dispense Auth. Provider   ibuprofen (ADVIL) 800 MG tablet Take 1 tablet (800 mg total) by mouth 3 (three) times daily for 14 days. 42 tablet Theadora Rama Scales, PA-C      PDMP not reviewed this encounter.  Pending results:  Labs Reviewed - No data to display  Medications Ordered in UC: Medications  ketorolac (TORADOL) injection 60 mg (60 mg Intramuscular Given 03/22/21 1658)    Disposition Upon Discharge:  Condition: stable for discharge home Home: take medications as prescribed; routine discharge  instructions as discussed; follow up as advised.  Patient presented with an acute illness with associated systemic symptoms and significant discomfort requiring urgent management. In my opinion, this is a condition that  a prudent lay person (someone who possesses an average knowledge of health and medicine) may potentially expect to result in complications if not addressed urgently such as respiratory distress, impairment of bodily function or dysfunction of bodily organs.   Routine symptom specific, illness specific and/or disease specific instructions were discussed with the patient and/or caregiver at length.   As such, the patient has been evaluated and assessed, work-up was performed and treatment was provided in alignment with urgent care protocols and evidence based medicine.  Patient/parent/caregiver has been advised that the patient may require follow up for further testing and treatment if the symptoms continue in spite of treatment, as clinically indicated and appropriate.  The patient was tested for COVID-19, Influenza and/or RSV, then the patient/parent/guardian was advised to isolate at home pending the results of his/her diagnostic coronavirus test and potentially longer if they're positive. I have also advised pt that if his/her COVID-19 test returns positive, it's recommended to self-isolate for at least 10 days after symptoms first appeared AND until fever-free for 24 hours without fever reducer AND other symptoms have improved or resolved. Discussed self-isolation recommendations as well as instructions for household member/close contacts as per the [facility] [place] [facility], and also gave patient the COVID packet with this information.  Patient/parent/caregiver has been advised to return to the [facility] or PCP in 3-5 days if no better; to PCP or the Emergency Department if new signs and symptoms develop, or if the current signs or symptoms continue to change or worsen for further workup, evaluation  and treatment as clinically indicated and appropriate  The patient will follow up with their current PCP if and as advised. If the patient does not currently have a PCP we will assist them in obtaining one.   The patient may need specialty follow up if the symptoms continue, in spite of conservative treatment and management, for further workup, evaluation, consultation and treatment as clinically indicated and appropriate.  Patient/parent/caregiver verbalized understanding and agreement of plan as discussed.  All questions were addressed during visit.  Please see discharge instructions below for further details of plan.  Discharge Instructions:   Discharge Instructions      You received an injection of ketorolac today in the office to relieve your pain.  I also provided you with a renewal of ibuprofen 800 mg to take 3 times daily as needed for pain.  We will assist you in finding a primary care provider to follow-up on your lower back pain and also to look for possible causes of your early arthritis in your back such as autoimmune disease.  Please be on the look out for a phone call from our referral department to help you get an appointment set up with primary care.        Theadora Rama Scales, PA-C 03/22/21 1729

## 2021-03-22 NOTE — Discharge Instructions (Signed)
You received an injection of ketorolac today in the office to relieve your pain.  I also provided you with a renewal of ibuprofen 800 mg to take 3 times daily as needed for pain.  We will assist you in finding a primary care provider to follow-up on your lower back pain and also to look for possible causes of your early arthritis in your back such as autoimmune disease.  Please be on the look out for a phone call from our referral department to help you get an appointment set up with primary care.

## 2021-03-22 NOTE — ED Triage Notes (Addendum)
Pt reports having lower back pain (states she has osteoarthritis) that has flared up and tylenol is not helping.

## 2021-12-06 ENCOUNTER — Ambulatory Visit (INDEPENDENT_AMBULATORY_CARE_PROVIDER_SITE_OTHER): Payer: Medicaid Other

## 2021-12-06 ENCOUNTER — Ambulatory Visit
Admission: EM | Admit: 2021-12-06 | Discharge: 2021-12-06 | Disposition: A | Discharge status: Home / Self Care | Payer: Medicaid Other | Attending: Emergency Medicine | Admitting: Emergency Medicine

## 2021-12-06 DIAGNOSIS — M545 Low back pain, unspecified: Secondary | ICD-10-CM

## 2021-12-06 DIAGNOSIS — M47816 Spondylosis without myelopathy or radiculopathy, lumbar region: Secondary | ICD-10-CM

## 2021-12-06 MED ORDER — IBUPROFEN 800 MG PO TABS: 800.0000 mg | ORAL_TABLET | Freq: Three times a day (TID) | ORAL | 0 refills | Status: AC | PRN

## 2021-12-06 MED ORDER — KETOROLAC TROMETHAMINE 30 MG/ML IJ SOLN
30.0000 mg | Freq: Once | INTRAMUSCULAR | Status: AC
  Administered 2021-12-06: 30 mg via INTRAMUSCULAR

## 2021-12-06 NOTE — ED Notes (Addendum)
PT ASSISTED IN FINDING A PCP (APPOINTMENT SCHEDULED) & MYCHART ACTIVATION.

## 2021-12-06 NOTE — ED Provider Notes (Addendum)
[facility]    CSN: [id number] Arrival date & time: 12/06/21  1540    HISTORY   Chief Complaint  Patient presents with   Back Pain   HPI April Allen is a pleasant, 36 y.o. female who presents to urgent care today. Patient complains of lower back pain that she states started again 2 days ago.  Patient denies loss of strength, loss of bowel or bladder control, numbness and tingling rating down her legs, recent fall.  Patient states she has been taking Tylenol arthritis without meaningful relief of her pain.  EMR reviewed, patient was seen by me December 2022 for this exact same issue.  Patient was advised to follow-up with PCP for possible rheumatology work-up.  Patient was advised of findings of lower back x-ray performed at a different urgent care in March 2022 which included mild facet arthropathy at L5-S1 with some indistinctness of the facet joint and subtle sclerosis along the iliac side of the sacroiliac joints.  During her encounter in December, pt was provided with a ketorolac injection and a prescription for ibuprofen 800 mg which she states today was helpful.  Patient states she has not followed up with a primary care provider since her last visit, states she does not have a primary care provider despite patient having Medicaid and having been assigned a primary care provider by Medicaid.  The history is provided by the patient.   History reviewed. No pertinent past medical history. There are no problems to display for this patient.  Past Surgical History:  Procedure Laterality Date   BREAST LUMPECTOMY     CESAREAN SECTION     OB History   No obstetric history on file.    Home Medications    Prior to Admission medications   Medication Sig Start Date End Date Taking? Authorizing Provider  acetaminophen (TYLENOL) 325 MG tablet Take 650 mg by mouth every 6 (six) hours as needed.    [provider]    Family History Family History  Problem  Relation Age of Onset   Hypertension Other    Diabetes Other    Kidney disease Other    Social History Social History   Tobacco Use   Smoking status: Never  Substance Use Topics   Alcohol use: No   Drug use: No   Allergies   Patient has no known allergies.  Review of Systems Review of Systems Pertinent findings revealed after performing a 14 point review of systems has been noted in the history of present illness.  Physical Exam Triage Vital Signs ED Triage Vitals  Enc Vitals Group     BP 02/12/21 0827 (!) 147/82     Pulse Rate 02/12/21 0827 72     Resp 02/12/21 0827 18     Temp 02/12/21 0827 98.3 F (36.8 C)     Temp Source 02/12/21 0827 Oral     SpO2 02/12/21 0827 98 %     Weight --      Height --      Head Circumference --      Peak Flow --      Pain Score 02/12/21 0826 5     Pain Loc --      Pain Edu? --      Excl. in GC? --    Updated Vital Signs BP 114/75 (BP Location: Left Arm)   Pulse 75   Temp 98.5 F (36.9 C) (Oral)   Resp 18   LMP 11/11/2021  SpO2 98%   Physical Exam Vitals and nursing note reviewed.  Constitutional:      General: She is not in acute distress.    Appearance: Normal appearance. She is not ill-appearing.  HENT:     Head: Normocephalic and atraumatic.  Eyes:     General: Lids are normal.        Right eye: No discharge.        Left eye: No discharge.     Extraocular Movements: Extraocular movements intact.     Conjunctiva/sclera: Conjunctivae normal.     Right eye: Right conjunctiva is not injected.     Left eye: Left conjunctiva is not injected.  Neck:     Trachea: Trachea and phonation normal.  Cardiovascular:     Rate and Rhythm: Normal rate and regular rhythm.     Pulses: Normal pulses.     Heart sounds: Normal heart sounds. No murmur heard.    No friction rub. No gallop.  Pulmonary:     Effort: Pulmonary effort is normal. No accessory muscle usage, prolonged expiration or respiratory distress.     Breath sounds:  Normal breath sounds. No stridor, decreased air movement or transmitted upper airway sounds. No decreased breath sounds, wheezing, rhonchi or rales.  Chest:     Chest wall: No tenderness.  Musculoskeletal:        General: Tenderness (Lumbar spine, right SI joint) present. Normal range of motion.     Cervical back: Normal range of motion and neck supple. Normal range of motion.  Lymphadenopathy:     Cervical: No cervical adenopathy.  Skin:    General: Skin is warm and dry.     Findings: No erythema or rash.  Neurological:     General: No focal deficit present.     Mental Status: She is alert and oriented to person, place, and time.  Psychiatric:        Mood and Affect: Mood normal.        Behavior: Behavior normal.     UC Couse / Diagnostics / Procedures:     Radiology DG Lumbar Spine Complete  Result Date: 12/06/2021 CLINICAL DATA:  Recurrent lower back pain. Worsening with weight-bearing. EXAM: LUMBAR SPINE - COMPLETE 4+ VIEW COMPARISON:  Radiographs dated July 03, 2020 FINDINGS: There is no evidence of lumbar spine fracture. Alignment is normal. Mild disc height loss and facet joint arthropathy at L5-S1. IMPRESSION: Mild disc height loss and associated facet joint arthropathy at L5-S1. Electronically Signed   By: Larose Hires D.O.   On: 12/06/2021 17:43    Procedures Procedures (including critical care time) EKG  Pending results:  Labs Reviewed - No data to display  Medications Ordered in UC: Medications  ketorolac (TORADOL) 30 MG/ML injection 30 mg (30 mg Intramuscular Given 12/06/21 1739)    UC Diagnoses / Final Clinical Impressions(s)   I have reviewed the triage vital signs and the nursing notes.  Pertinent labs & imaging results that were available during my care of the patient were reviewed by me and considered in my medical decision making (see chart for details).    Final diagnoses:  Arthropathy of lumbar facet joint  Patient advised of x-ray findings compared  to previous.  Patient was provided with an injection during their visit today for acute pain relief.  Patient was again provided with a prescription for ibuprofen 800 mg and advised to follow-up with primary care.  PCP appointment was scheduled for patient prior to discharge.  ED Prescriptions  Medication Sig Dispense Auth. Provider   ibuprofen (ADVIL) 800 MG tablet Take 1 tablet (800 mg total) by mouth every 8 (eight) hours as needed for up to 21 doses for fever, headache, mild pain or moderate pain. 21 tablet Theadora Rama Scales, PA-C      PDMP not reviewed this encounter.  Discharge Instructions:   Discharge Instructions      The x-ray we performed in the clinic today was similar to the x-ray performed at [facility] urgent care in March 2022.  You received an injection of ketorolac today in the office to relieve your pain.  I also provided you with a renewal of ibuprofen 800 mg to take 3 times daily as needed for pain.    Continuing to visit urgent care for this issue is not the standard of care that you need for this issue.  We have assisted you in finding a primary care provider to follow-up on your lower back pain and to look for possible causes of your early arthritis in your back such as autoimmune disease.  Please schedule an appointment as soon as possible for further evaluation.      Disposition Upon Discharge:  Condition: stable for discharge home Home: take medications as prescribed; routine discharge instructions as discussed; follow up as advised.  Patient presented with an acute illness with associated systemic symptoms and significant discomfort requiring urgent management. In my opinion, this is a condition that a prudent lay person (someone who possesses an average knowledge of health and medicine) may potentially expect to result in complications if not addressed urgently such as respiratory distress, impairment of bodily function or dysfunction of  bodily organs.   Routine symptom specific, illness specific and/or disease specific instructions were discussed with the patient and/or caregiver at length.   As such, the patient has been evaluated and assessed, work-up was performed and treatment was provided in alignment with urgent care protocols and evidence based medicine.  Patient/parent/caregiver has been advised that the patient may require follow up for further testing and treatment if the symptoms continue in spite of treatment, as clinically indicated and appropriate.  Patient/parent/caregiver has been advised to report to orthopedic urgent care clinic or return to the [facility] or PCP in 3-5 days if no better; follow-up with orthopedics, PCP or the Emergency Department if new signs and symptoms develop or if the current signs or symptoms continue to change or worsen for further workup, evaluation and treatment as clinically indicated and appropriate  The patient will follow up with their current PCP if and as advised. If the patient does not currently have a PCP we will have assisted them in obtaining one.   The patient may need specialty follow up if the symptoms continue, in spite of conservative treatment and management, for further workup, evaluation, consultation and treatment as clinically indicated and appropriate.  Patient/parent/caregiver verbalized understanding and agreement of plan as discussed.  All questions were addressed during visit.  Please see discharge instructions below for further details of plan.  This office note has been dictated using Teaching laboratory technician.  Unfortunately, this method of dictation can sometimes lead to typographical or grammatical errors.  I apologize for your inconvenience in advance if this occurs.  Please do not hesitate to reach out to me if clarification is needed.      Theadora Rama Scales, PA-C 12/06/21 1806    Theadora Rama Scales, PA-C 12/06/21 1806

## 2021-12-06 NOTE — Discharge Instructions (Addendum)
The x-ray we performed in the clinic today was similar to the x-ray performed at [facility] urgent care in March 2022.  You received an injection of ketorolac today in the office to relieve your pain.  I also provided you with a renewal of ibuprofen 800 mg to take 3 times daily as needed for pain.    Continuing to visit urgent care for this issue is not the standard of care that you need for this issue.  We have assisted you in finding a primary care provider to follow-up on your lower back pain and to look for possible causes of your early arthritis in your back such as autoimmune disease.  Please schedule an appointment as soon as possible for further evaluation.

## 2021-12-06 NOTE — ED Triage Notes (Signed)
The pt c/o lower back pain that started again on Saturday.  Home interventions: Tylenol- arthritis

## 2021-12-28 ENCOUNTER — Ambulatory Visit: Payer: Self-pay | Admitting: Family Medicine

## 2021-12-28 ENCOUNTER — Telehealth: Payer: Self-pay | Admitting: General Practice

## 2021-12-28 NOTE — Telephone Encounter (Signed)
Pt called stating she needed to reschedule her NP appt with Dr. Carmelia Roller for today due to being called into work. 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 Pt was advised that a note would have to be sent back for approval for rescheduling as the cancellation was same day. Pt acknowledged understanding.
